# Patient Record
Sex: Female | Born: 2005 | Race: White | Hispanic: No | Marital: Single | Smoking: Never smoker
Health system: Southern US, Community
[De-identification: ages and names within clinical notes are randomized; demographics above are authoritative.]

---
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
## 2024-03-25 ENCOUNTER — Other Ambulatory Visit: Payer: Self-pay | Admitting: Family

## 2024-03-25 DIAGNOSIS — Z3009 Encounter for other general counseling and advice on contraception: Secondary | ICD-10-CM

## 2024-03-25 NOTE — Telephone Encounter (Signed)
 [facility] last PE 01/31/23 NO RF sent to pharmacy last OV greater than a year

## 2024-03-25 NOTE — Telephone Encounter (Signed)
 I called Pt & spoke to mom about RX. She said that she doesn't need this med & she didn't request to get a refill on it, so I did not make any appt's for her to come in w/[facility].

## 2024-04-16 ENCOUNTER — Encounter: Payer: Self-pay | Admitting: Family

## 2024-04-16 ENCOUNTER — Ambulatory Visit: Payer: Self-pay | Admitting: Family

## 2024-04-16 VITALS — BP 125/75 | HR 77 | Temp 97.7°F | Ht <= 58 in | Wt 104.8 lb

## 2024-04-16 DIAGNOSIS — Z0001 Encounter for general adult medical examination with abnormal findings: Secondary | ICD-10-CM | POA: Diagnosis not present

## 2024-04-16 DIAGNOSIS — Z3009 Encounter for other general counseling and advice on contraception: Secondary | ICD-10-CM | POA: Insufficient documentation

## 2024-04-16 DIAGNOSIS — B009 Herpesviral infection, unspecified: Secondary | ICD-10-CM | POA: Insufficient documentation

## 2024-04-16 DIAGNOSIS — Z0000 Encounter for general adult medical examination without abnormal findings: Secondary | ICD-10-CM

## 2024-04-16 MED ORDER — MEDROXYPROGESTERONE ACETATE 150 MG/ML IM SUSY: PREFILLED_SYRINGE | INTRAMUSCULAR | 4 refills | Status: AC

## 2024-04-16 MED ORDER — VALACYCLOVIR HCL 1 G PO TABS: 2000.0000 mg | ORAL_TABLET | Freq: Two times a day (BID) | ORAL | 3 refills | Status: AC

## 2024-04-16 NOTE — Patient Instructions (Signed)

## 2024-04-16 NOTE — Addendum Note (Signed)
 Addended by: LAVELL LYE A on: 04/16/2024 10:43 AM   Modules accepted: Level of Service

## 2024-04-16 NOTE — Progress Notes (Signed)
 Subjective:    Patient ID: Caitlin Petersen, female    DOB: 07/16/2005, 18 y.o.   MRN: [id number]  Chief Complaint  Patient presents with   Annual Exam    HPI PT presents to the office today for CPE. Pt currently Depo Provera  every 3 months. Her last shot was August. Has slight break through bleeding for a few days.   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 Her last out break was last year.   Pt denies any headache, palpitations, SOB, or edema at this time.    Review of Systems  All other systems reviewed and are negative.   Social History   Socioeconomic History   Marital status: Single    Spouse name: Not on file   Number of children: Not on file   Years of education: Not on file   Highest education level: Not on file  Occupational History   Not on file  Tobacco Use   Smoking status: Never   Smokeless tobacco: Never  Vaping Use   Vaping status: Never Used  Substance and Sexual Activity   Alcohol use: No    Alcohol/week: 0.0 standard drinks of alcohol   Drug use: No   Sexual activity: Not on file  Other Topics Concern   Not on file  Social History Narrative   Not on file   Social Drivers of Health   Financial Resource Strain: Not on file  Food Insecurity: Not on file  Transportation Needs: Not on file  Physical Activity: Not on file  Stress: Not on file  Social Connections: Not on file   History reviewed. No pertinent family history.      Objective:   Physical Exam Vitals reviewed.  Constitutional:      General: She is not in acute distress.    Appearance: She is well-developed.  HENT:     Head: Normocephalic and atraumatic.     Right Ear: Tympanic membrane normal.     Left Ear: Tympanic membrane normal.  Eyes:     Pupils: Pupils are equal, round, and reactive to light.  Neck:     Thyroid: No thyromegaly.  Cardiovascular:     Rate and Rhythm: Normal rate and regular rhythm.     Heart sounds: Normal heart sounds. No murmur heard. Pulmonary:      Effort: Pulmonary effort is normal. No respiratory distress.     Breath sounds: Normal breath sounds. No wheezing.  Abdominal:     General: Bowel sounds are normal. There is no distension.     Palpations: Abdomen is soft.     Tenderness: There is no abdominal tenderness.  Musculoskeletal:        General: No tenderness. Normal range of motion.     Cervical back: Normal range of motion and neck supple.  Skin:    General: Skin is warm and dry.  Neurological:     Mental Status: She is alert and oriented to person, place, and time.     Cranial Nerves: No cranial nerve deficit.     Deep Tendon Reflexes: Reflexes are normal and symmetric.  Psychiatric:        Behavior: Behavior normal.        Thought Content: Thought content normal.        Judgment: Judgment normal.       BP 125/75   Pulse 77   Temp 97.7 F (36.5 C) (Temporal)   Ht 4' 9 (1.448 m)   Wt 104 lb 12.8 oz (  47.5 kg)   BMI 22.68 kg/m      Assessment & Plan:  Caitlin Petersen comes in today with chief complaint of Annual Exam   Diagnosis and orders addressed:  1. Birth control counseling Safe sex - medroxyPROGESTERone  Acetate 150 MG/ML SUSY; INJECT 1 ML INTO THE MUSCLE EVERY 3 MONTHS  Dispense: 1 mL; Refill: 4  2. Herpes simplex Take valtrex  as needed  - valACYclovir  (VALTREX ) 1000 MG tablet; Take 2 tablets (2,000 mg total) by mouth 2 (two) times daily.  Dispense: 4 tablet; Refill: 3  3. Annual physical exam (Primary) Health Maintenance reviewed Healthy Diet and exercise encouraged  Return in about 1 year (around 04/16/2025), or if symptoms worsen or fail to improve.    Bari Learn, FNP
